# Patient Record
Sex: Male | Born: 2000 | Race: Black or African American | Hispanic: No | Marital: Single | Smoking: Never smoker
Health system: Southern US, Community
[De-identification: ages and names within clinical notes are randomized; demographics above are authoritative.]

## PROBLEM LIST (undated history)

## (undated) ENCOUNTER — Emergency Department: Admission: EM | Payer: Medicaid Other

## (undated) DIAGNOSIS — H18609 Keratoconus, unspecified, unspecified eye: Secondary | ICD-10-CM

---
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
## 2021-02-13 ENCOUNTER — Emergency Department: Payer: Medicaid Other

## 2021-02-13 ENCOUNTER — Emergency Department
Admission: EM | Admit: 2021-02-13 | Discharge: 2021-02-13 | Disposition: A | Discharge status: Home / Self Care | Payer: Medicaid Other | Attending: Emergency Medicine | Admitting: Emergency Medicine

## 2021-02-13 DIAGNOSIS — Y9367 Activity, basketball: Secondary | ICD-10-CM | POA: Diagnosis not present

## 2021-02-13 DIAGNOSIS — S93402A Sprain of unspecified ligament of left ankle, initial encounter: Secondary | ICD-10-CM | POA: Insufficient documentation

## 2021-02-13 DIAGNOSIS — S99912A Unspecified injury of left ankle, initial encounter: Secondary | ICD-10-CM | POA: Diagnosis present

## 2021-02-13 DIAGNOSIS — X58XXXA Exposure to other specified factors, initial encounter: Secondary | ICD-10-CM | POA: Insufficient documentation

## 2021-02-13 NOTE — Discharge Instructions (Signed)
You can take Tylenol and ibuprofen alternating for ankle pain. 

## 2021-02-13 NOTE — ED Provider Notes (Signed)
[facility] Provider Note  Patient Contact: 11:51 PM (approximate)   History   Ankle Pain and Toe Pain   HPI  Ernest Mitchell is a 21 y.o. male presents to the emergency department with left lateral and medial ankle pain after playing basketball tonight.  Patient states he inverted his ankle.  Patient is also concerned that he may have fractured his right toes.  He denies recent prior injury.      Physical Exam   Triage Vital Signs: ED Triage Vitals  Enc Vitals Group     BP 02/13/21 2144 138/81     Pulse Rate 02/13/21 2144 74     Resp 02/13/21 2144 15     Temp 02/13/21 2151 98.3 F (36.8 C)     Temp Source 02/13/21 2151 Oral     SpO2 02/13/21 2144 100 %     Weight --      Height --      Head Circumference --      Peak Flow --      Pain Score 02/13/21 2150 6     Pain Loc --      Pain Edu? --      Excl. in GC? --     Most recent vital signs: Vitals:   02/13/21 2151 02/13/21 2348  BP:  119/73  Pulse:  77  Resp:    Temp: 98.3 F (36.8 C)   SpO2:  98%     General: Alert and in no acute distress. Eyes:  PERRL. EOMI. Head: No acute traumatic findings ENT:      Ears:       Nose: No congestion/rhinnorhea.      Mouth/Throat: Mucous membranes are moist. Neck: No stridor. No cervical spine tenderness to palpation. Cardiovascular:  Good peripheral perfusion Respiratory: Normal respiratory effort without tachypnea or retractions. Lungs CTAB. Good air entry to the bases with no decreased or absent breath sounds. Gastrointestinal: Bowel sounds 4 quadrants. Soft and nontender to palpation. No guarding or rigidity. No palpable masses. No distention. No CVA tenderness. Musculoskeletal: Full range of motion to all extremities.  Patient has tenderness to palpation over the anterior talofibular ligament and the deltoid ligament on the left.  Palpable dorsalis pedis pulse bilaterally and symmetrically. Neurologic:  No gross focal neurologic deficits  are appreciated.  Skin:   No rash noted Other:   ED Results / Procedures / Treatments   Labs (all labs ordered are listed, but only abnormal results are displayed) Labs Reviewed - No data to display      RADIOLOGY  I personally viewed and evaluated these images as part of my medical decision making, as well as reviewing the written report by the radiologist.  ED Provider Interpretation: I personally reviewed x-rays of the left ankle and the right foot and no acute bony abnormalities were visualized.   PROCEDURES:  Critical Care performed: No  Procedures   MEDICATIONS ORDERED IN ED: Medications - No data to display   IMPRESSION / MDM / ASSESSMENT AND PLAN / ED COURSE  I reviewed the triage vital signs and the nursing notes.                              Assessment and Plan: Left ankle pain:  Right foot pain:   Differential diagnosis includes, but is not limited to, ankle sprain, foot contusion  21 year old male presents to the emergency department with left ankle pain  and pain in the right toes.  No bony abnormality was visualized on dedicated x-rays.  Recommended Tylenol and ibuprofen and Ace wrap was applied to left ankle.   FINAL CLINICAL IMPRESSION(S) / ED DIAGNOSES   Final diagnoses:  Sprain of left ankle, unspecified ligament, initial encounter     Rx / DC Orders   ED Discharge Orders     None        Note:  This document was prepared using Dragon voice recognition software and may include unintentional dictation errors.   Pia Mau [place], PA-C 02/13/21 2353    Minna Antis, MD 02/14/21 2204

## 2021-02-13 NOTE — ED Notes (Signed)
NAD noted at time of D/C. Pt denies questions or concerns. Pt ambulatory to the lobby at this time. Verbal consent for D/C obtained.  

## 2021-02-13 NOTE — ED Triage Notes (Signed)
Pt states he was playing basketball today and rolled his left ankle. It is painful and swollen but pt is able to ambulate. Pt also states that his second and third toes on his right foot are possibly fractured. Denies any injury to the toes but states his shoes were too small.
